# Patient Record
Sex: Male | Born: 1990 | Race: White | Hispanic: No | Marital: Single | Smoking: Never smoker
Health system: Southern US, Community
[De-identification: ages and names within clinical notes are randomized; demographics above are authoritative.]

---
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
## 2013-07-12 ENCOUNTER — Emergency Department (HOSPITAL_COMMUNITY)
Admission: EM | Admit: 2013-07-12 | Discharge: 2013-07-12 | Disposition: A | Discharge status: Home / Self Care | Payer: Self-pay | Attending: Emergency Medicine | Admitting: Emergency Medicine

## 2013-07-12 ENCOUNTER — Encounter (HOSPITAL_COMMUNITY): Payer: Self-pay | Admitting: Emergency Medicine

## 2013-07-12 DIAGNOSIS — R221 Localized swelling, mass and lump, neck: Secondary | ICD-10-CM

## 2013-07-12 DIAGNOSIS — R220 Localized swelling, mass and lump, head: Secondary | ICD-10-CM | POA: Insufficient documentation

## 2013-07-12 DIAGNOSIS — K029 Dental caries, unspecified: Secondary | ICD-10-CM | POA: Insufficient documentation

## 2013-07-12 MED ORDER — PENICILLIN V POTASSIUM 250 MG PO TABS
500.0000 mg | ORAL_TABLET | Freq: Once | ORAL | Status: AC
  Administered 2013-07-12: 500 mg via ORAL
  Filled 2013-07-12: qty 2

## 2013-07-12 MED ORDER — IBUPROFEN 800 MG PO TABS
800.0000 mg | ORAL_TABLET | Freq: Once | ORAL | Status: AC
  Administered 2013-07-12: 800 mg via ORAL
  Filled 2013-07-12: qty 1

## 2013-07-12 MED ORDER — AMOXICILLIN 500 MG PO CAPS: 500.0000 mg | ORAL_CAPSULE | Freq: Three times a day (TID) | ORAL | Status: DC

## 2013-07-12 MED ORDER — ACETAMINOPHEN 325 MG PO TABS
650.0000 mg | ORAL_TABLET | Freq: Once | ORAL | Status: AC
  Administered 2013-07-12: 650 mg via ORAL
  Filled 2013-07-12: qty 2

## 2013-07-12 MED ORDER — IBUPROFEN 800 MG PO TABS: 800.0000 mg | ORAL_TABLET | Freq: Three times a day (TID) | ORAL | Status: DC

## 2013-07-12 NOTE — ED Provider Notes (Signed)
CSN: [id number]     Arrival date & time 07/12/13  1811 History   First MD Initiated Contact with Patient 07/12/13 2041     Chief Complaint  Patient presents with  . Dental Pain   (Consider location/radiation/quality/duration/timing/severity/associated sxs/prior Treatment) Patient is a 23 y.o. male presenting with tooth pain. The history is provided by the patient.  Dental Pain Location:  Upper Quality:  Aching and throbbing Severity:  Moderate Onset quality:  Sudden Duration:  1 day Timing:  Intermittent Progression:  Worsening Chronicity:  Chronic Context: dental caries and poor dentition   Relieved by:  Nothing Worsened by:  Cold food/drink Ineffective treatments:  None tried Associated symptoms: gum swelling   Associated symptoms: no difficulty swallowing and no neck pain   Risk factors: no diabetes     History reviewed. No pertinent past medical history. History reviewed. No pertinent past surgical history. No family history on file. History  Substance Use Topics  . Smoking status: Never Smoker   . Smokeless tobacco: Not on file  . Alcohol Use: No    Review of Systems  Constitutional: Negative for activity change.       All ROS Neg except as noted in HPI  HENT: Positive for dental problem. Negative for nosebleeds.   Eyes: Negative for photophobia and discharge.  Respiratory: Negative for cough, shortness of breath and wheezing.   Cardiovascular: Negative for chest pain and palpitations.  Gastrointestinal: Negative for abdominal pain and blood in stool.  Genitourinary: Negative for dysuria, frequency and hematuria.  Musculoskeletal: Negative for arthralgias, back pain and neck pain.  Skin: Negative.   Neurological: Negative for dizziness, seizures and speech difficulty.  Psychiatric/Behavioral: Negative for hallucinations and confusion.    Allergies  Review of patient's allergies indicates no known allergies.  Home Medications  No current outpatient  prescriptions on file. BP 141/73  Pulse 88  Temp(Src) 98.2 F (36.8 C) (Oral)  Resp 24  Ht 5' 10.5" (1.791 m)  Wt 171 lb (77.565 kg)  BMI 24.18 kg/m2  SpO2 99% Physical Exam  Nursing note and vitals reviewed. Constitutional: He is oriented to person, place, and time. He appears well-developed and well-nourished.  Non-toxic appearance.  HENT:  Head: Normocephalic.  Right Ear: Tympanic membrane and external ear normal.  Left Ear: Tympanic membrane and external ear normal.  Multiple dental caries of the right and left, upper and lower jaw. The airway is patent. There is no swelling under the tongue. There is no visible abscess.  Eyes: EOM and lids are normal. Pupils are equal, round, and reactive to light.  Neck: Normal range of motion. Neck supple. Carotid bruit is not present.  Cardiovascular: Normal rate, regular rhythm, normal heart sounds, intact distal pulses and normal pulses.   Pulmonary/Chest: Breath sounds normal. No respiratory distress.  Abdominal: Soft. Bowel sounds are normal. There is no tenderness. There is no guarding.  Musculoskeletal: Normal range of motion.  Lymphadenopathy:       Head (right side): No submandibular adenopathy present.       Head (left side): No submandibular adenopathy present.    He has no cervical adenopathy.  Neurological: He is alert and oriented to person, place, and time. He has normal strength. No cranial nerve deficit or sensory deficit.  Skin: Skin is warm and dry.  Psychiatric: He has a normal mood and affect. His speech is normal.    ED Course  Procedures (including critical care time) Labs Review Labs Reviewed - No data to display Imaging  Review No results found.  EKG Interpretation   None       MDM  No diagnosis found. *I have reviewed nursing notes, vital signs, and all appropriate lab and imaging results for this patient.**  Patient has had problems with dental caries for nearly 2 years. This morning he noted new  swelling involving the right side. He has some pain with chewing. There's been no high fever. There's been no nausea vomiting reported.  The plan at this time is for the patient be placed on Amoxil 500 mg, and ibuprofen 800 mg. The patient states that he is taken off tomorrow, February 5 to attempt to see a dentist.  Kathie DikeHobson M Raynah Gomes, PA-C 07/12/13 2126

## 2013-07-12 NOTE — Discharge Instructions (Signed)
Dental Caries °Dental caries is tooth decay. This decay can cause a hole in teeth (cavity) that can get bigger and deeper over time. °HOME CARE °· Brush and floss your teeth. Do this at least two times a day. °· Use a fluoride toothpaste. °· Use a mouth rinse if told by your dentist or doctor. °· Eat less sugary and starchy foods. Drink less sugary drinks. °· Avoid snacking often on sugary and starchy foods. Avoid sipping often on sugary drinks. °· Keep regular checkups and cleanings with your dentist. °· Use fluoride supplements if told by your dentist or doctor. °· Allow fluoride to be applied to teeth if told by your dentist or doctor. °MAKE SURE YOU: °· Understand these instructions. °· Will watch your condition. °· Will get help right away if you are not doing well or get worse. °Document Released: 03/03/2008 Document Revised: 01/25/2013 Document Reviewed: 05/27/2012 °[employer]® Patient Information ©2014 [employer]. ° °

## 2013-07-12 NOTE — ED Notes (Signed)
Pt reports has been having a toothache off and on for the past 2 years.  Woke up this morning with r sided jaw swelling.

## 2013-07-12 NOTE — ED Provider Notes (Signed)
Medical screening examination/treatment/procedure(s) were performed by non-physician practitioner and as supervising physician I was immediately available for consultation/collaboration.  EKG Interpretation   None         Nimesh Riolo L Almadelia Looman, MD 07/12/13 2227 

## 2016-09-02 ENCOUNTER — Encounter (HOSPITAL_COMMUNITY): Payer: Self-pay | Admitting: *Deleted

## 2016-09-02 ENCOUNTER — Emergency Department (HOSPITAL_COMMUNITY)
Admission: EM | Admit: 2016-09-02 | Discharge: 2016-09-02 | Disposition: A | Discharge status: Home / Self Care | Payer: BLUE CROSS/BLUE SHIELD | Attending: Emergency Medicine | Admitting: Emergency Medicine

## 2016-09-02 DIAGNOSIS — Y990 Civilian activity done for income or pay: Secondary | ICD-10-CM | POA: Diagnosis not present

## 2016-09-02 DIAGNOSIS — Z79899 Other long term (current) drug therapy: Secondary | ICD-10-CM | POA: Insufficient documentation

## 2016-09-02 DIAGNOSIS — Y939 Activity, unspecified: Secondary | ICD-10-CM | POA: Diagnosis not present

## 2016-09-02 DIAGNOSIS — T24032A Burn of unspecified degree of left lower leg, initial encounter: Secondary | ICD-10-CM | POA: Diagnosis present

## 2016-09-02 DIAGNOSIS — X141XXA Other contact with hot air and other hot gases, initial encounter: Secondary | ICD-10-CM | POA: Diagnosis not present

## 2016-09-02 DIAGNOSIS — T24202A Burn of second degree of unspecified site of left lower limb, except ankle and foot, initial encounter: Secondary | ICD-10-CM

## 2016-09-02 DIAGNOSIS — T24232A Burn of second degree of left lower leg, initial encounter: Secondary | ICD-10-CM | POA: Diagnosis not present

## 2016-09-02 DIAGNOSIS — Y929 Unspecified place or not applicable: Secondary | ICD-10-CM | POA: Diagnosis not present

## 2016-09-02 MED ORDER — SILVER SULFADIAZINE 1 % EX CREA
TOPICAL_CREAM | Freq: Once | CUTANEOUS | Status: AC
  Administered 2016-09-02: 16:00:00 via TOPICAL
  Filled 2016-09-02: qty 170

## 2016-09-02 MED ORDER — IBUPROFEN 400 MG PO TABS
600.0000 mg | ORAL_TABLET | Freq: Once | ORAL | Status: AC
  Administered 2016-09-02: 600 mg via ORAL
  Filled 2016-09-02: qty 1
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
## 2016-09-02 NOTE — Discharge Instructions (Signed)
Make sure you are cleaning her wound and dressing every day. Apply the Silvadene cream. Follow-up in 2-3 days for wound recheck. Avoid popping the blisters. Motrin and Tylenol for pain. May use cool compresses to help with the pain.
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
## 2018-07-15 ENCOUNTER — Ambulatory Visit (INDEPENDENT_AMBULATORY_CARE_PROVIDER_SITE_OTHER): Payer: BLUE CROSS/BLUE SHIELD

## 2018-07-15 ENCOUNTER — Encounter (HOSPITAL_COMMUNITY): Payer: Self-pay | Admitting: Emergency Medicine

## 2018-07-15 ENCOUNTER — Ambulatory Visit (HOSPITAL_COMMUNITY)
Admission: EM | Admit: 2018-07-15 | Discharge: 2018-07-15 | Disposition: A | Discharge status: Home / Self Care | Payer: BLUE CROSS/BLUE SHIELD | Attending: Family Medicine | Admitting: Family Medicine

## 2018-07-15 DIAGNOSIS — S20212A Contusion of left front wall of thorax, initial encounter: Secondary | ICD-10-CM | POA: Diagnosis not present

## 2018-07-15 MED ORDER — IBUPROFEN 800 MG PO TABS: 800.0000 mg | ORAL_TABLET | Freq: Three times a day (TID) | ORAL | 0 refills | Status: DC
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
## 2018-07-15 NOTE — Discharge Instructions (Addendum)
TAKE THE IBUPROFEN 3 X A DAY WITH FOOD LIMIT HEAVY LIFTING A FEW DAYS

## 2020-12-06 ENCOUNTER — Emergency Department (HOSPITAL_COMMUNITY)
Admission: EM | Admit: 2020-12-06 | Discharge: 2020-12-06 | Disposition: A | Discharge status: Home / Self Care | Payer: Self-pay | Attending: Emergency Medicine | Admitting: Emergency Medicine

## 2020-12-06 ENCOUNTER — Emergency Department (HOSPITAL_COMMUNITY): Payer: Self-pay

## 2020-12-06 ENCOUNTER — Other Ambulatory Visit: Payer: Self-pay

## 2020-12-06 ENCOUNTER — Encounter (HOSPITAL_COMMUNITY): Payer: Self-pay | Admitting: *Deleted

## 2020-12-06 DIAGNOSIS — M7532 Calcific tendinitis of left shoulder: Secondary | ICD-10-CM | POA: Insufficient documentation

## 2020-12-06 DIAGNOSIS — S4992XA Unspecified injury of left shoulder and upper arm, initial encounter: Secondary | ICD-10-CM | POA: Insufficient documentation

## 2020-12-06 DIAGNOSIS — R030 Elevated blood-pressure reading, without diagnosis of hypertension: Secondary | ICD-10-CM | POA: Insufficient documentation

## 2020-12-06 DIAGNOSIS — X500XXA Overexertion from strenuous movement or load, initial encounter: Secondary | ICD-10-CM | POA: Insufficient documentation

## 2020-12-06 MED ORDER — NAPROXEN 500 MG PO TABS: 500.0000 mg | ORAL_TABLET | Freq: Two times a day (BID) | ORAL | 0 refills | Status: DC

## 2020-12-06 NOTE — ED Triage Notes (Signed)
Pt with left shoulder pain since lifting something heavy last week.  Unable to lift arm all the way.  Pt works in Teacher, English as a foreign language.

## 2020-12-06 NOTE — ED Notes (Signed)
ED Provider at bedside. 

## 2020-12-06 NOTE — Discharge Instructions (Addendum)
Your x-ray shows that you likely have a calcific tendinitis of your left shoulder.  This will likely get better on its own.  You may apply ice packs on and off to your shoulder, take the medication as directed.  Follow-up with the orthopedic doctor listed in 1 week if your symptoms or not improving.  Also, your blood pressure today was elevated.  This will need to be rechecked.  I have recommended a primary care provider for you to establish primary care regarding your blood pressure.  Return emergency department for any new or worsening symptoms.

## 2020-12-10 NOTE — ED Provider Notes (Signed)
[facility] EMERGENCY DEPARTMENT Provider Note   CSN: [id number] Arrival date & time: 12/06/20  1118     History Chief Complaint  Patient presents with   Shoulder Pain    SHALON COUNCILMAN is a 30 y.o. male.   Shoulder Pain Associated symptoms: no fever       ESHAAN TITZER is a 30 y.o. male who presents to the Emergency Department complaining of persistent pain of his left shoulder associated with movement.  Symptoms present for 1 week.  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  He pick something up last week and felt pain to the top and front of his left shoulder.  He states he is unable to raise his arm behind or over his head without significant pain.  He has tried over-the-counter medications without relief.  He denies any neck or jaw pain, chest pain, weakness or numbness of his left arm.    History reviewed. No pertinent past medical history.  There are no problems to display for this patient.   History reviewed. No pertinent surgical history.     History reviewed. No pertinent family history.  Social History   Tobacco Use   Smoking status: Never   Smokeless tobacco: Never  Substance Use Topics   Alcohol use: No   Drug use: No    Home Medications Prior to Admission medications   Medication Sig Start Date End Date Taking? Authorizing Provider  naproxen (NAPROSYN) 500 MG tablet Take 1 tablet (500 mg total) by mouth 2 (two) times daily. Take with food 12/06/20  Yes Gor Vestal, PA-C    Allergies    Patient has no known allergies.  Review of Systems   Review of Systems  Constitutional:  Negative for chills and fever.  Eyes:  Negative for visual disturbance.  Respiratory:  Negative for shortness of breath.   Cardiovascular:  Negative for chest pain.  Gastrointestinal:  Negative for nausea and vomiting.  Musculoskeletal:  Positive for arthralgias (Left shoulder pain). Negative for joint swelling.  Skin:  Negative for color  change and wound.  Neurological:  Negative for dizziness, weakness, numbness and headaches.   Physical Exam Updated Vital Signs BP (!) 174/108 (BP Location: Right Arm)   Pulse 65   Temp 97.6 F (36.4 C) (Oral)   Resp 12   Ht 5\' 10"  (1.778 m)   Wt 79.4 kg   SpO2 97%   BMI 25.11 kg/m   Physical Exam Vitals and nursing note reviewed.  Constitutional:      Appearance: Normal appearance. He is not toxic-appearing.  HENT:     Head: Atraumatic.  Cardiovascular:     Rate and Rhythm: Normal rate and regular rhythm.     Pulses: Normal pulses.  Pulmonary:     Effort: Pulmonary effort is normal.     Breath sounds: Normal breath sounds.  Musculoskeletal:        General: Tenderness and signs of injury present. No swelling.     Left shoulder: Tenderness present. No swelling, effusion, bony tenderness or crepitus. Decreased range of motion. Normal strength. Normal pulse.     Cervical back: Full passive range of motion without pain and normal range of motion.     Comments: Pain with abduction of the left arm greater than 45 degrees.  Point tenderness at the acromion.  No edema, crepitus, or excessive warmth of the joint.  Grip strengths are strong and symmetrical  Skin:    General: Skin  is warm and dry.     Findings: No rash.  Neurological:     Mental Status: He is alert and oriented to person, place, and time.    ED Results / Procedures / Treatments   Labs (all labs ordered are listed, but only abnormal results are displayed) Labs Reviewed - No data to display  EKG None  Radiology DG Shoulder Left  Result Date: 12/06/2020 CLINICAL DATA:  Left shoulder pain with injury EXAM: LEFT SHOULDER - 2+ VIEW COMPARISON:  None. FINDINGS: Normal alignment.  No fracture or joint space narrowing Amorphous soft tissue calcification superior to the acromion likely due to calcific tendinitis. IMPRESSION: Calcific tendinitis superior to the acromion. Electronically Signed   By: Marlan Palau M.D.    On: 12/06/2020 12:31     Procedures Procedures   Medications Ordered in ED Medications - No data to display  ED Course  I have reviewed the triage vital signs and the nursing notes.  Pertinent labs & imaging results that were available during my care of the patient were reviewed by me and considered in my medical decision making (see chart for details).    MDM Rules/Calculators/A&P                          Patient here withLeft shoulder pain, reproduced with abduction   Neurovascularly intact.  Symptoms began after heavy lifting.  X-ray shows likely calcific tendinitis.  No concerning symptoms for septic joint.  Patient well-appearing.  He is hypertensive,  No history. I have discussed importance of close follow-up with PCP regarding his hypertension.  He is without associated symptoms.  Patient agreeable to monitor blood pressure at home, salt restrictions and close outpatient follow-up.   Final Clinical Impression(s) / ED Diagnoses Final diagnoses:  Calcific tendinitis of left shoulder region  Elevated blood pressure reading    Rx / DC Orders ED Discharge Orders          Ordered    naproxen (NAPROSYN) 500 MG tablet  2 times daily        12/06/20 1421             Pauline Aus, PA-C 12/10/20 1409    Vanetta Mulders, MD 12/13/20 0725

## 2022-07-21 IMAGING — DX DG SHOULDER 2+V*L*
3 series · 3 of 3 positions shown · non-contrast
Comparison: None.

CLINICAL DATA: Left shoulder pain with injury

EXAM:
LEFT SHOULDER - 2+ VIEW

[shoulder grashey]
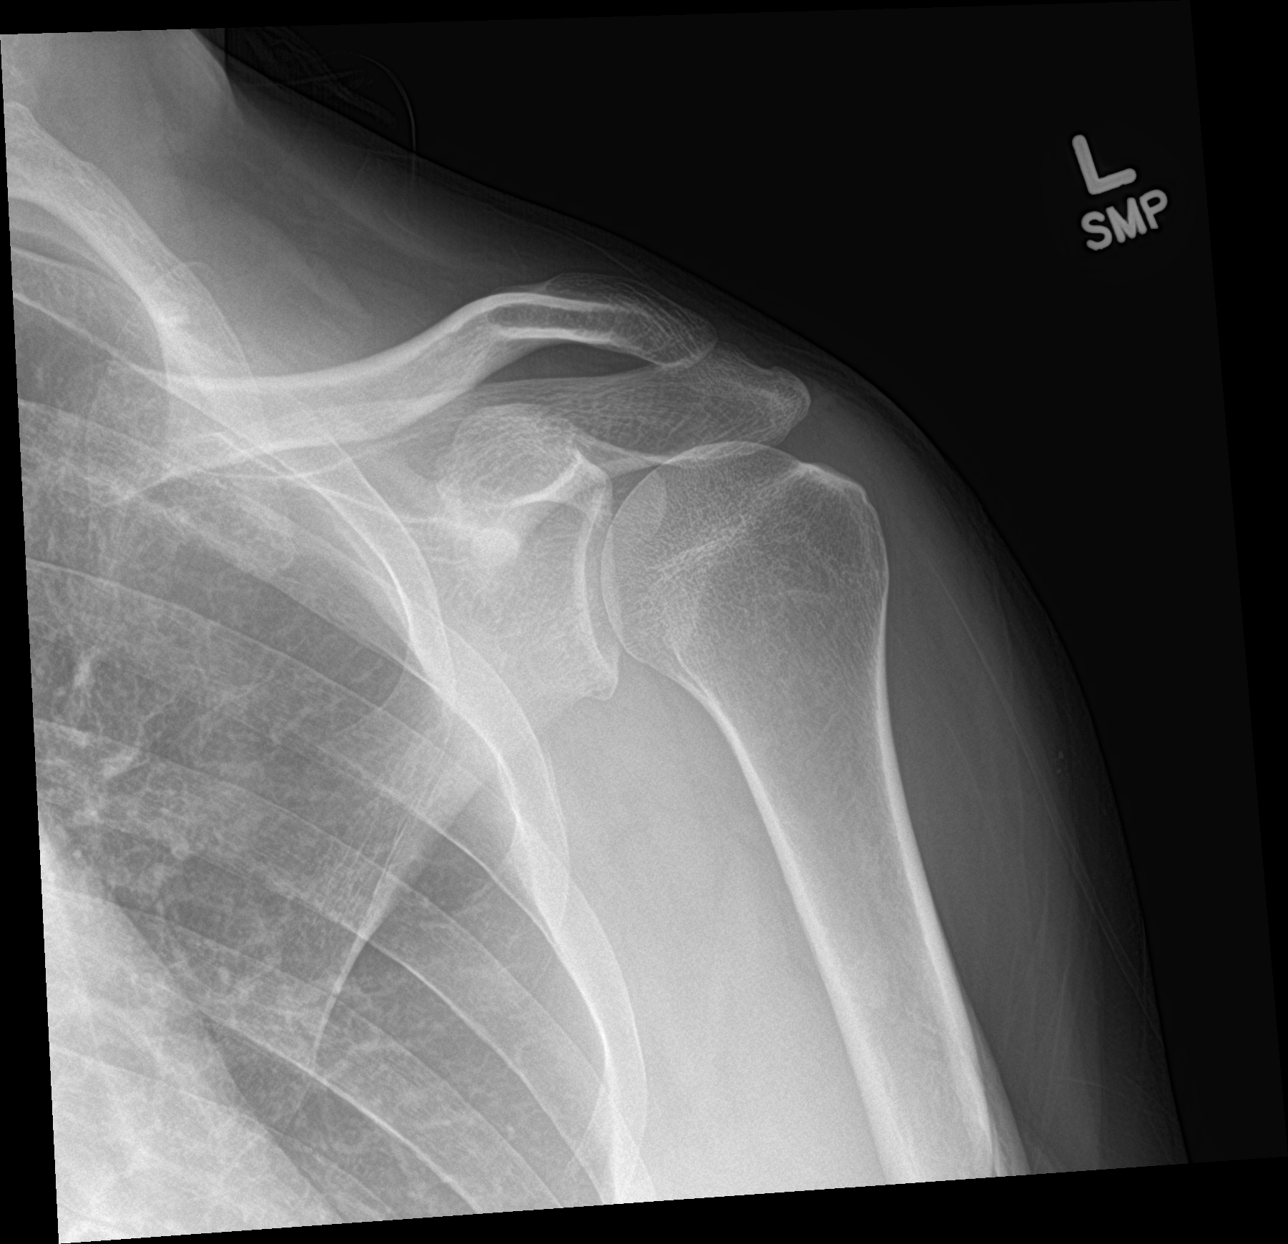

[shoulder y view]
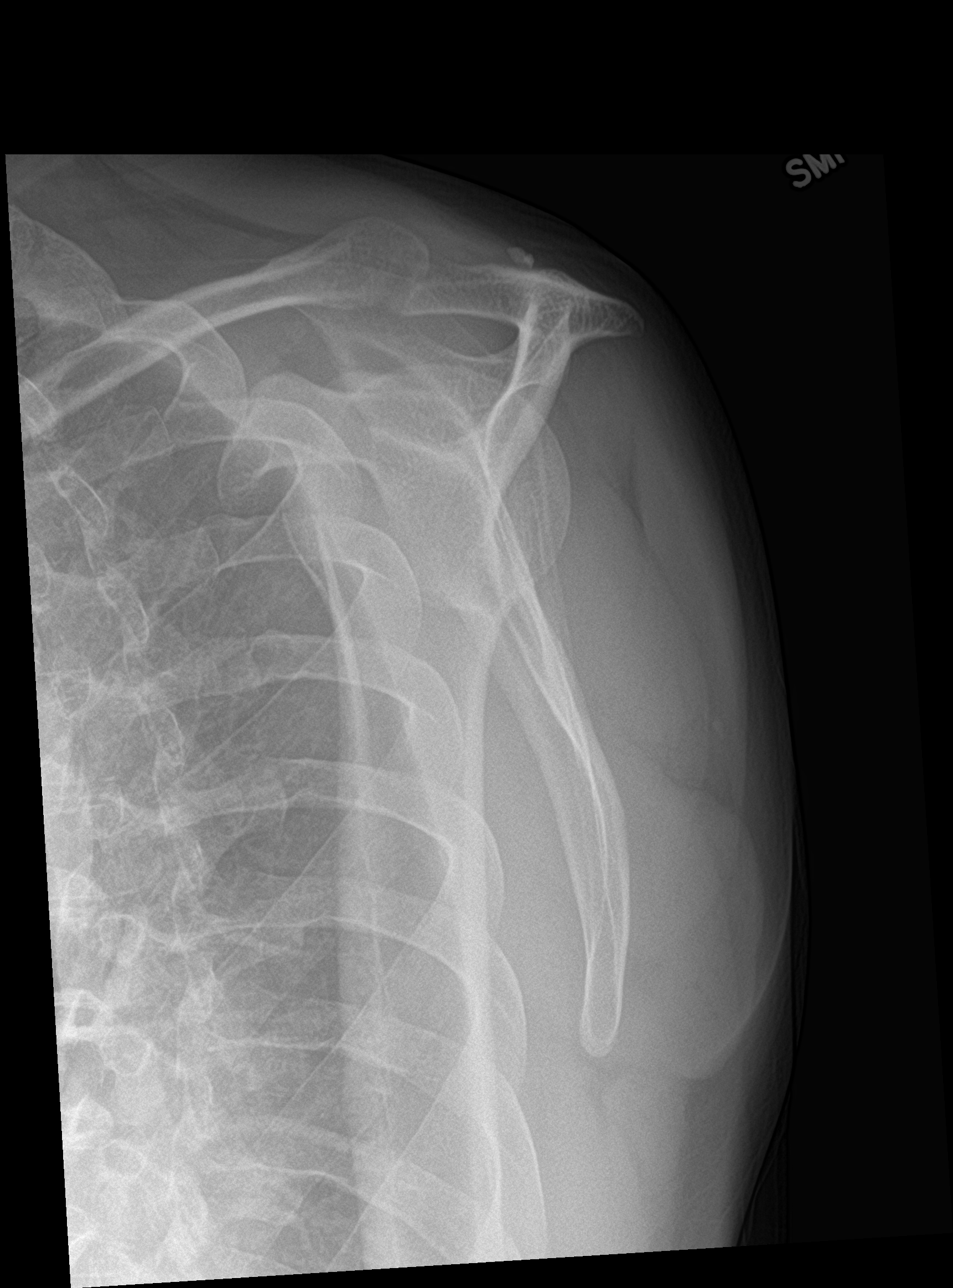

[shoulder axillary]
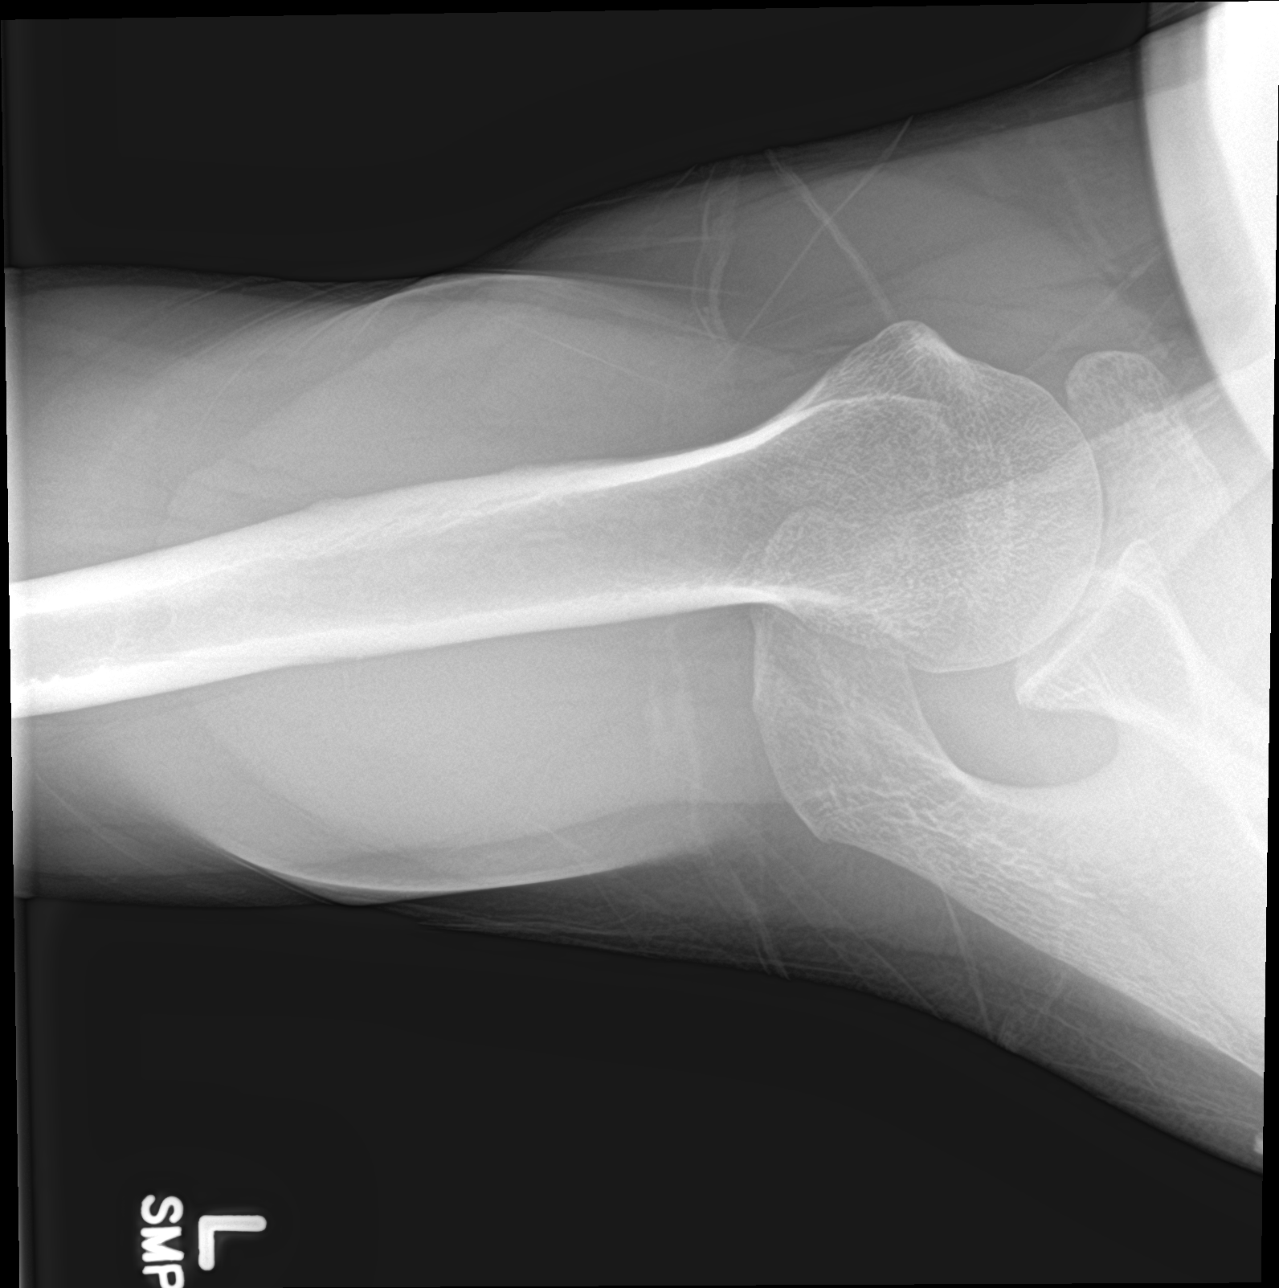

[3 of 3 positions shown; findings below may reference images not displayed]

FINDINGS: Normal alignment.  No fracture or joint space narrowing

Amorphous soft tissue calcification superior to the acromion likely
due to calcific tendinitis.
IMPRESSION: Calcific tendinitis superior to the acromion.

## 2023-08-26 ENCOUNTER — Other Ambulatory Visit: Payer: Self-pay

## 2023-08-26 ENCOUNTER — Ambulatory Visit (HOSPITAL_COMMUNITY)
Admission: EM | Admit: 2023-08-26 | Discharge: 2023-08-26 | Disposition: A | Discharge status: Home / Self Care | Payer: Self-pay

## 2023-08-26 ENCOUNTER — Emergency Department (HOSPITAL_COMMUNITY)
Admission: EM | Admit: 2023-08-26 | Discharge: 2023-08-27 | Disposition: A | Discharge status: Home / Self Care | Payer: Self-pay | Attending: Emergency Medicine | Admitting: Emergency Medicine

## 2023-08-26 DIAGNOSIS — X102XXA Contact with fats and cooking oils, initial encounter: Secondary | ICD-10-CM | POA: Insufficient documentation

## 2023-08-26 DIAGNOSIS — T22212A Burn of second degree of left forearm, initial encounter: Secondary | ICD-10-CM | POA: Insufficient documentation

## 2023-08-26 DIAGNOSIS — Z5321 Procedure and treatment not carried out due to patient leaving prior to being seen by health care provider: Secondary | ICD-10-CM | POA: Insufficient documentation

## 2023-08-26 DIAGNOSIS — T22092A Burn of unspecified degree of multiple sites of left shoulder and upper limb, except wrist and hand, initial encounter: Secondary | ICD-10-CM

## 2023-08-26 MED ORDER — OXYCODONE-ACETAMINOPHEN 5-325 MG PO TABS
2.0000 | ORAL_TABLET | Freq: Once | ORAL | Status: AC
  Administered 2023-08-26: 2 via ORAL
  Filled 2023-08-26: qty 2

## 2023-08-26 MED ORDER — SILVER SULFADIAZINE 1 % EX CREA
TOPICAL_CREAM | Freq: Once | CUTANEOUS | Status: AC
  Filled 2023-08-26: qty 85
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
## 2023-08-26 NOTE — Discharge Instructions (Signed)
 Discharge to ER for further evaluation
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
## 2023-08-27 NOTE — ED Notes (Signed)
 No response 15+ mins for vitals
